# Patient Record
Sex: Female | Born: 1940 | Race: White | Hispanic: No | Marital: Married | Smoking: Former smoker
Health system: Southern US, Community
[De-identification: ages and names within clinical notes are randomized; demographics above are authoritative.]

## PROBLEM LIST (undated history)

## (undated) DIAGNOSIS — I10 Essential (primary) hypertension: Secondary | ICD-10-CM

## (undated) HISTORY — PX: TUBAL LIGATION: SHX77

## (undated) HISTORY — PX: ELBOW SURGERY: SHX618

## (undated) HISTORY — DX: Essential (primary) hypertension: I10

---

## 1998-04-17 ENCOUNTER — Other Ambulatory Visit: Admission: RE | Admit: 1998-04-17 | Discharge: 1998-04-17 | Payer: Self-pay | Admitting: Obstetrics & Gynecology

## 1999-04-10 ENCOUNTER — Other Ambulatory Visit: Admission: RE | Admit: 1999-04-10 | Discharge: 1999-04-10 | Payer: Self-pay | Admitting: Obstetrics and Gynecology

## 1999-04-10 ENCOUNTER — Encounter (INDEPENDENT_AMBULATORY_CARE_PROVIDER_SITE_OTHER): Payer: Self-pay

## 1999-08-14 ENCOUNTER — Encounter: Payer: Self-pay | Admitting: Obstetrics and Gynecology

## 1999-08-14 ENCOUNTER — Encounter: Admission: RE | Admit: 1999-08-14 | Discharge: 1999-08-14 | Payer: Self-pay | Admitting: Obstetrics & Gynecology

## 2001-09-18 ENCOUNTER — Other Ambulatory Visit: Admission: RE | Admit: 2001-09-18 | Discharge: 2001-09-18 | Payer: Self-pay | Admitting: Obstetrics & Gynecology

## 2003-03-29 ENCOUNTER — Other Ambulatory Visit: Admission: RE | Admit: 2003-03-29 | Discharge: 2003-03-29 | Payer: Self-pay | Admitting: Obstetrics & Gynecology

## 2005-04-25 ENCOUNTER — Other Ambulatory Visit: Admission: RE | Admit: 2005-04-25 | Discharge: 2005-04-25 | Payer: Self-pay | Admitting: Obstetrics & Gynecology

## 2007-09-21 ENCOUNTER — Encounter (INDEPENDENT_AMBULATORY_CARE_PROVIDER_SITE_OTHER): Payer: Self-pay | Admitting: General Surgery

## 2007-09-21 ENCOUNTER — Ambulatory Visit (HOSPITAL_COMMUNITY): Admission: RE | Admit: 2007-09-21 | Discharge: 2007-09-21 | Payer: Self-pay | Admitting: General Surgery

## 2009-01-05 ENCOUNTER — Encounter: Payer: Self-pay | Admitting: Cardiology

## 2009-01-05 ENCOUNTER — Ambulatory Visit: Payer: Self-pay | Admitting: Cardiology

## 2009-01-05 DIAGNOSIS — I6529 Occlusion and stenosis of unspecified carotid artery: Secondary | ICD-10-CM

## 2009-01-05 DIAGNOSIS — E782 Mixed hyperlipidemia: Secondary | ICD-10-CM

## 2009-01-05 DIAGNOSIS — R079 Chest pain, unspecified: Secondary | ICD-10-CM | POA: Insufficient documentation

## 2009-01-26 ENCOUNTER — Encounter: Payer: Self-pay | Admitting: Cardiology

## 2009-01-26 ENCOUNTER — Ambulatory Visit: Payer: Self-pay

## 2011-02-05 NOTE — Op Note (Signed)
[name], Abigail Bauer                ACCOUNT NO.:  [id number]   MEDICAL RECORD NO.:  [id number]          PATIENT TYPE:  AMB   LOCATION:  DAY                          FACILITY:  [facility]   PHYSICIAN:  Timothy E. Earlene Plater, M.D. DATE OF BIRTH:  [birth year]   DATE OF PROCEDURE:  09/21/2007  DATE OF DISCHARGE:                               OPERATIVE REPORT   PREOPERATIVE DIAGNOSIS:  Anal polyp.   POSTOPERATIVE DIAGNOSIS:  Anal polyp.   PROCEDURE:  Excision of anal polyp.   SURGEON:  Timothy E. Earlene Plater, M.D.   ANESTHESIA:  Local with standby.   Abigail Bauer recently was discovered to have an anal polyp.  Though she  knew it was present, it was not too bothersome.  Because of its large  size, excision is recommended.  She is seen, identified and the permit  signed.   DESCRIPTION OF PROCEDURE:  She is taken to the operating room, placed  supine, moderate IV sedation given.  She was placed in lithotomy.  Perianal area inspected, prepped and draped in the usual fashion.  The  polyp was easily prolapsable in the posterior position.  Anoscopy was  otherwise negative.  The polyp and the base were injected with Marcaine  0.5% with epinephrine and Wydase.  That was massaged in well and then at  the base of the polyp, a suture ligature was placed and the polyp was  cut off.  No complications.  Polyp submitted to pathology.  The wound was satisfactory.  Dressing applied.  Counts correct.  She  tolerated it well and was removed to recovery room in good condition.   Written and verbal instructions given including Vicodin.  She will be  seen and followed as an outpatient.      Timothy E. Earlene Plater, M.D.  Electronically Signed     TED/MEDQ  D:  09/21/2007  T:  09/21/2007  Job:  [id number]   cc:   Nadine Counts  Fax: [phone]   Griffith Citron, M.D.  Fax: [phone]   W. Varney Baas, M.D.  Fax: [phone]

## 2011-06-28 LAB — HEMOGLOBIN AND HEMATOCRIT, BLOOD
HCT: 37.9
Hemoglobin: 12.9

## 2011-06-28 LAB — BASIC METABOLIC PANEL
BUN: 10
CO2: 28
Calcium: 9.5
Chloride: 104
Creatinine, Ser: 0.9
GFR calc Af Amer: >60
GFR calc non Af Amer: >60
Glucose, Bld: 110 — ABNORMAL HIGH
Potassium: 3.7
Sodium: 141

## 2013-12-23 ENCOUNTER — Ambulatory Visit (INDEPENDENT_AMBULATORY_CARE_PROVIDER_SITE_OTHER): Payer: Medicare Other

## 2013-12-23 VITALS — BP 121/66 | HR 67 | Resp 18

## 2013-12-23 DIAGNOSIS — B351 Tinea unguium: Secondary | ICD-10-CM

## 2013-12-23 DIAGNOSIS — M79609 Pain in unspecified limb: Secondary | ICD-10-CM

## 2013-12-23 MED ORDER — TAVABOROLE 5 % EX SOLN: CUTANEOUS | Status: AC

## 2013-12-23 NOTE — Patient Instructions (Signed)
Onychomycosis/Fungal Toenails  WHAT IS IT? An infection that lies within the keratin of your nail plate that is caused by a fungus.  WHY ME? Fungal infections affect all ages, sexes, races, and creeds.  There may be many factors that predispose you to a fungal infection such as age, coexisting medical conditions such as diabetes, or an autoimmune disease; stress, medications, fatigue, genetics, etc.  Bottom line: fungus thrives in a warm, moist environment and your shoes offer such a location.  IS IT CONTAGIOUS? Theoretically, yes.  You do not want to share shoes, nail clippers or files with someone who has fungal toenails.  Walking around barefoot in the same room or sleeping in the same bed is unlikely to transfer the organism.  It is important to realize, however, that fungus can spread easily from one nail to the next on the same foot.  HOW DO WE TREAT THIS?  There are several ways to treat this condition.  Treatment may depend on many factors such as age, medications, pregnancy, liver and kidney conditions, etc.  It is best to ask your doctor which options are available to you.  1. No treatment.   Unlike many other medical concerns, you can live with this condition.  However for many people this can be a painful condition and may lead to ingrown toenails or a bacterial infection.  It is recommended that you keep the nails cut short to help reduce the amount of fungal nail. 2. Topical treatment.  These range from herbal remedies to prescription strength nail lacquers.  About 40-50% effective, topicals require twice daily application for approximately 9 to 12 months or until an entirely new nail has grown out.  The most effective topicals are medical grade medications available through physicians offices. 3. Oral antifungal medications.  With an 80-90% cure rate, the most common oral medication requires 3 to 4 months of therapy and stays in your system for a year as the new nail grows out.  Oral  antifungal medications do require blood work to make sure it is a safe drug for you.  A liver function panel will be performed prior to starting the medication and after the first month of treatment.  It is important to have the blood work performed to avoid any harmful side effects.  In general, this medication safe but blood work is required. 4. Laser Therapy.  This treatment is performed by applying a specialized laser to the affected nail plate.  This therapy is noninvasive, fast, and non-painful.  It is not covered by insurance and is therefore, out of pocket.  The results have been very good with a 80-95% cure rate.  [facility] is the only practice in the area to offer this therapy. 5. Permanent Nail Avulsion.  Removing the entire nail so that a new nail will not grow back.  Applied the kerydin topical nail medication as instructed once daily to each of the affected toenails for 12 months duration. Discontinue there is any irritation to skin or nail fold or any signs of ingrowing nail followup in the interim as needed for possible trimming if unable to cut'

## 2013-12-23 NOTE — Progress Notes (Signed)
   Subjective:    Patient ID: Abigail Bauer, female    DOB: Mar 31, 1941, 73 y.o.   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       Objective:   Physical Exam Neurovascular status appears to be intact pedal pulses palpable epicritic and proprioceptive sensations intact bilateral nails 1 and 5 bilateral show thickening hypertrophy and separation from the nailbed and incurvation crepitus. The fifth toenail on the right show some darkening posse some contusion patient case in her toes at the end of her shoes and some shoes and slight forward in the shoes causing some trauma or contusion of the toe discuss possibilities of neoplasm of the nail however is unlikely this is more likely Morton 10 or 15 year long-term onychomycosis. At this time patient is tried previous oral and fungal medications with little or no success but may not completely full regimen. Is not inch to the toes was try something as alternative at this time we discussed and recommended topical antifungal treatment should note neurovascular status intact pedal pulses are palpable epicritic and proprioceptive sensations intact there no contraindications to the medication at this time. Did make recommendation that if she does work outside she has a small been you should worsen instead of using sneakers a high top trail running shoe or hiking boot would be more appropriate and adequate space to toebox to prevent future contusion injury to the nails       Assessment & Plan:  Assessment onychomycosis and some pain discomfort nails 1 through 1 and 5 bilateral  at this time prescription for [place] Mon is patient will initiate topical antifungal as instructed once daily to the affected nails for 12 months duration followup in 3-6 months for palliative care if needed or nail check patient is advised may take 6 months to start seeing improvement  Harriet Masson DPM

## 2014-08-24 ENCOUNTER — Other Ambulatory Visit: Payer: Self-pay | Admitting: Obstetrics and Gynecology

## 2014-08-25 LAB — CYTOLOGY - PAP

## 2014-08-29 ENCOUNTER — Other Ambulatory Visit: Payer: Self-pay | Admitting: Obstetrics and Gynecology

## 2014-08-29 DIAGNOSIS — R928 Other abnormal and inconclusive findings on diagnostic imaging of breast: Secondary | ICD-10-CM

## 2014-09-13 ENCOUNTER — Ambulatory Visit
Admission: RE | Admit: 2014-09-13 | Discharge: 2014-09-13 | Disposition: A | Discharge status: Home / Self Care | Payer: Medicare Other | Source: Ambulatory Visit | Attending: Obstetrics and Gynecology | Admitting: Obstetrics and Gynecology

## 2014-09-13 DIAGNOSIS — R928 Other abnormal and inconclusive findings on diagnostic imaging of breast: Secondary | ICD-10-CM

## 2015-10-16 DIAGNOSIS — E7800 Pure hypercholesterolemia, unspecified: Secondary | ICD-10-CM | POA: Diagnosis not present

## 2015-10-16 DIAGNOSIS — Z79899 Other long term (current) drug therapy: Secondary | ICD-10-CM | POA: Diagnosis not present

## 2015-12-06 DIAGNOSIS — H2513 Age-related nuclear cataract, bilateral: Secondary | ICD-10-CM | POA: Diagnosis not present

## 2015-12-06 DIAGNOSIS — H2511 Age-related nuclear cataract, right eye: Secondary | ICD-10-CM | POA: Diagnosis not present

## 2015-12-06 DIAGNOSIS — H353131 Nonexudative age-related macular degeneration, bilateral, early dry stage: Secondary | ICD-10-CM | POA: Diagnosis not present

## 2016-01-18 DIAGNOSIS — H353132 Nonexudative age-related macular degeneration, bilateral, intermediate dry stage: Secondary | ICD-10-CM | POA: Diagnosis not present

## 2016-01-18 DIAGNOSIS — H2511 Age-related nuclear cataract, right eye: Secondary | ICD-10-CM | POA: Diagnosis not present

## 2016-01-18 DIAGNOSIS — H40013 Open angle with borderline findings, low risk, bilateral: Secondary | ICD-10-CM | POA: Diagnosis not present

## 2016-01-18 DIAGNOSIS — H2512 Age-related nuclear cataract, left eye: Secondary | ICD-10-CM | POA: Diagnosis not present

## 2016-02-14 DIAGNOSIS — H259 Unspecified age-related cataract: Secondary | ICD-10-CM | POA: Diagnosis not present

## 2016-02-14 DIAGNOSIS — I10 Essential (primary) hypertension: Secondary | ICD-10-CM | POA: Diagnosis not present

## 2016-02-14 DIAGNOSIS — Z6820 Body mass index (BMI) 20.0-20.9, adult: Secondary | ICD-10-CM | POA: Diagnosis not present

## 2016-02-14 DIAGNOSIS — E785 Hyperlipidemia, unspecified: Secondary | ICD-10-CM | POA: Diagnosis not present

## 2016-02-14 DIAGNOSIS — R0989 Other specified symptoms and signs involving the circulatory and respiratory systems: Secondary | ICD-10-CM | POA: Diagnosis not present

## 2016-02-19 DIAGNOSIS — R0989 Other specified symptoms and signs involving the circulatory and respiratory systems: Secondary | ICD-10-CM | POA: Diagnosis not present

## 2016-02-19 DIAGNOSIS — I6523 Occlusion and stenosis of bilateral carotid arteries: Secondary | ICD-10-CM | POA: Diagnosis not present

## 2016-03-17 IMAGING — MG MM DIAGNOSTIC UNILATERAL L
3 series · 3 of 3 positions shown · non-contrast
Comparison: Prior exams

CLINICAL DATA: Callback from screening for a possible mass or
asymmetry in the left breast on the MLO view only.

EXAM:
DIGITAL DIAGNOSTIC  LEFT MAMMOGRAM

[L MLO (1 of 2)]
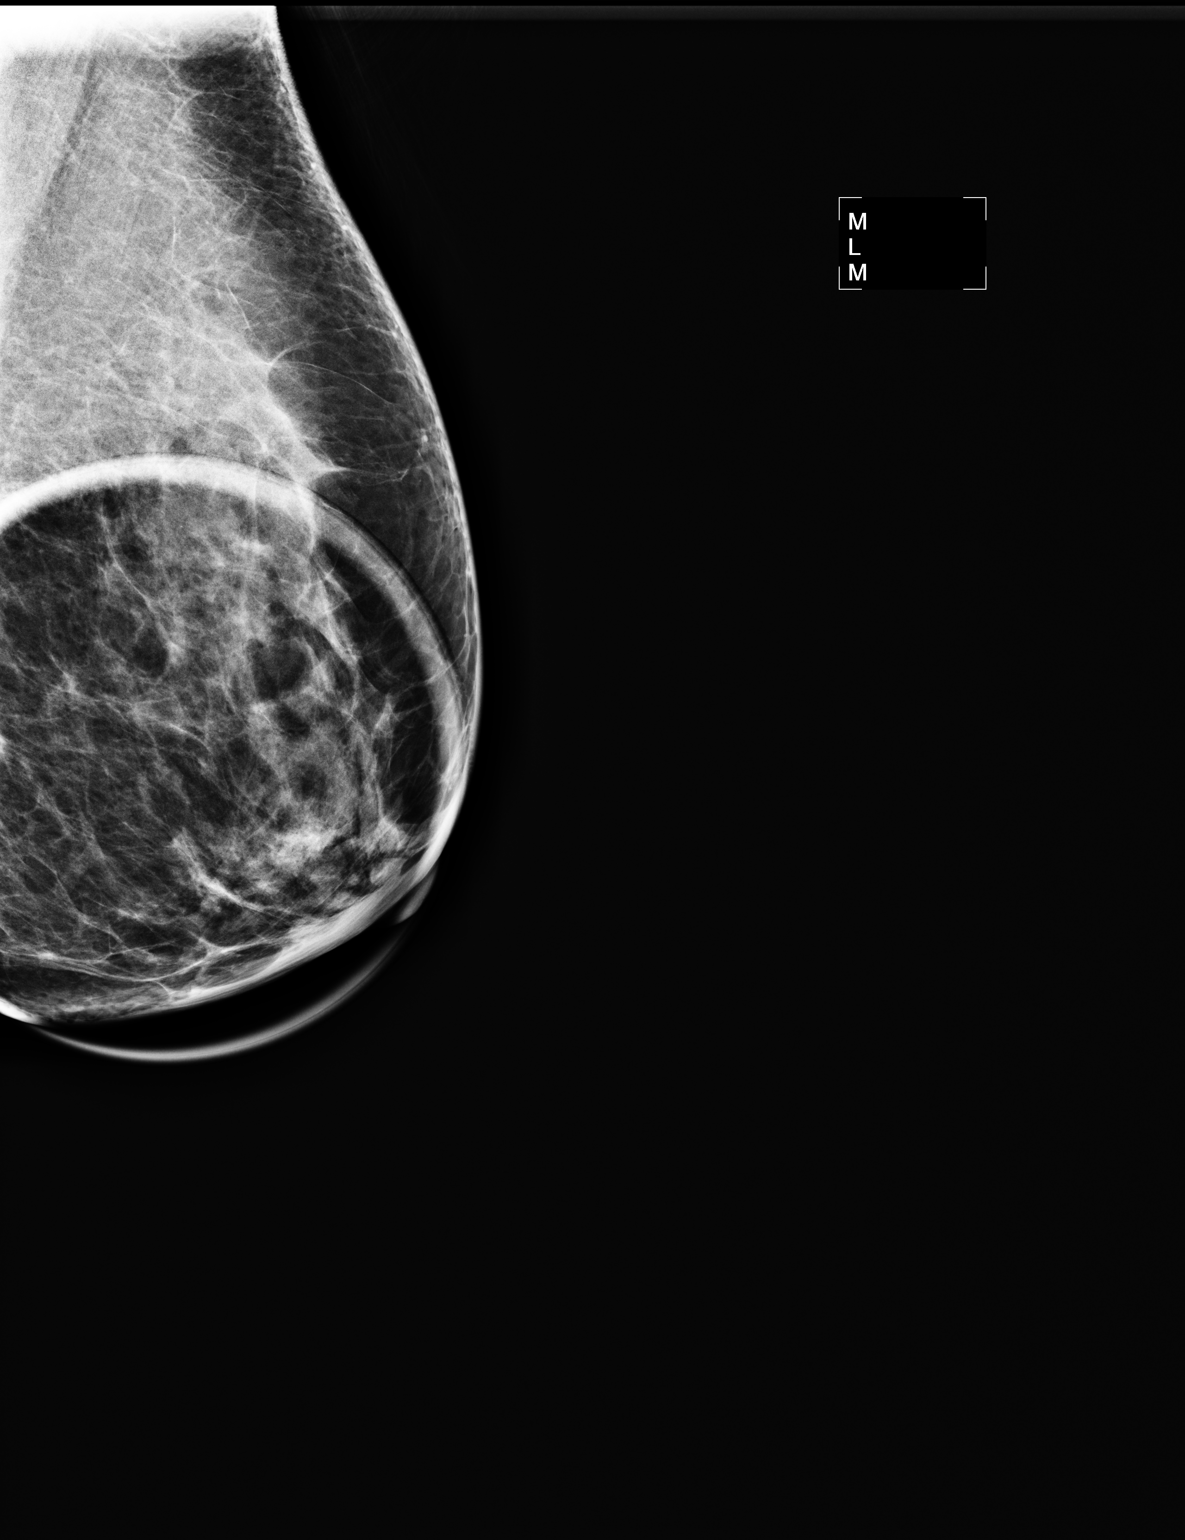

[L ML]
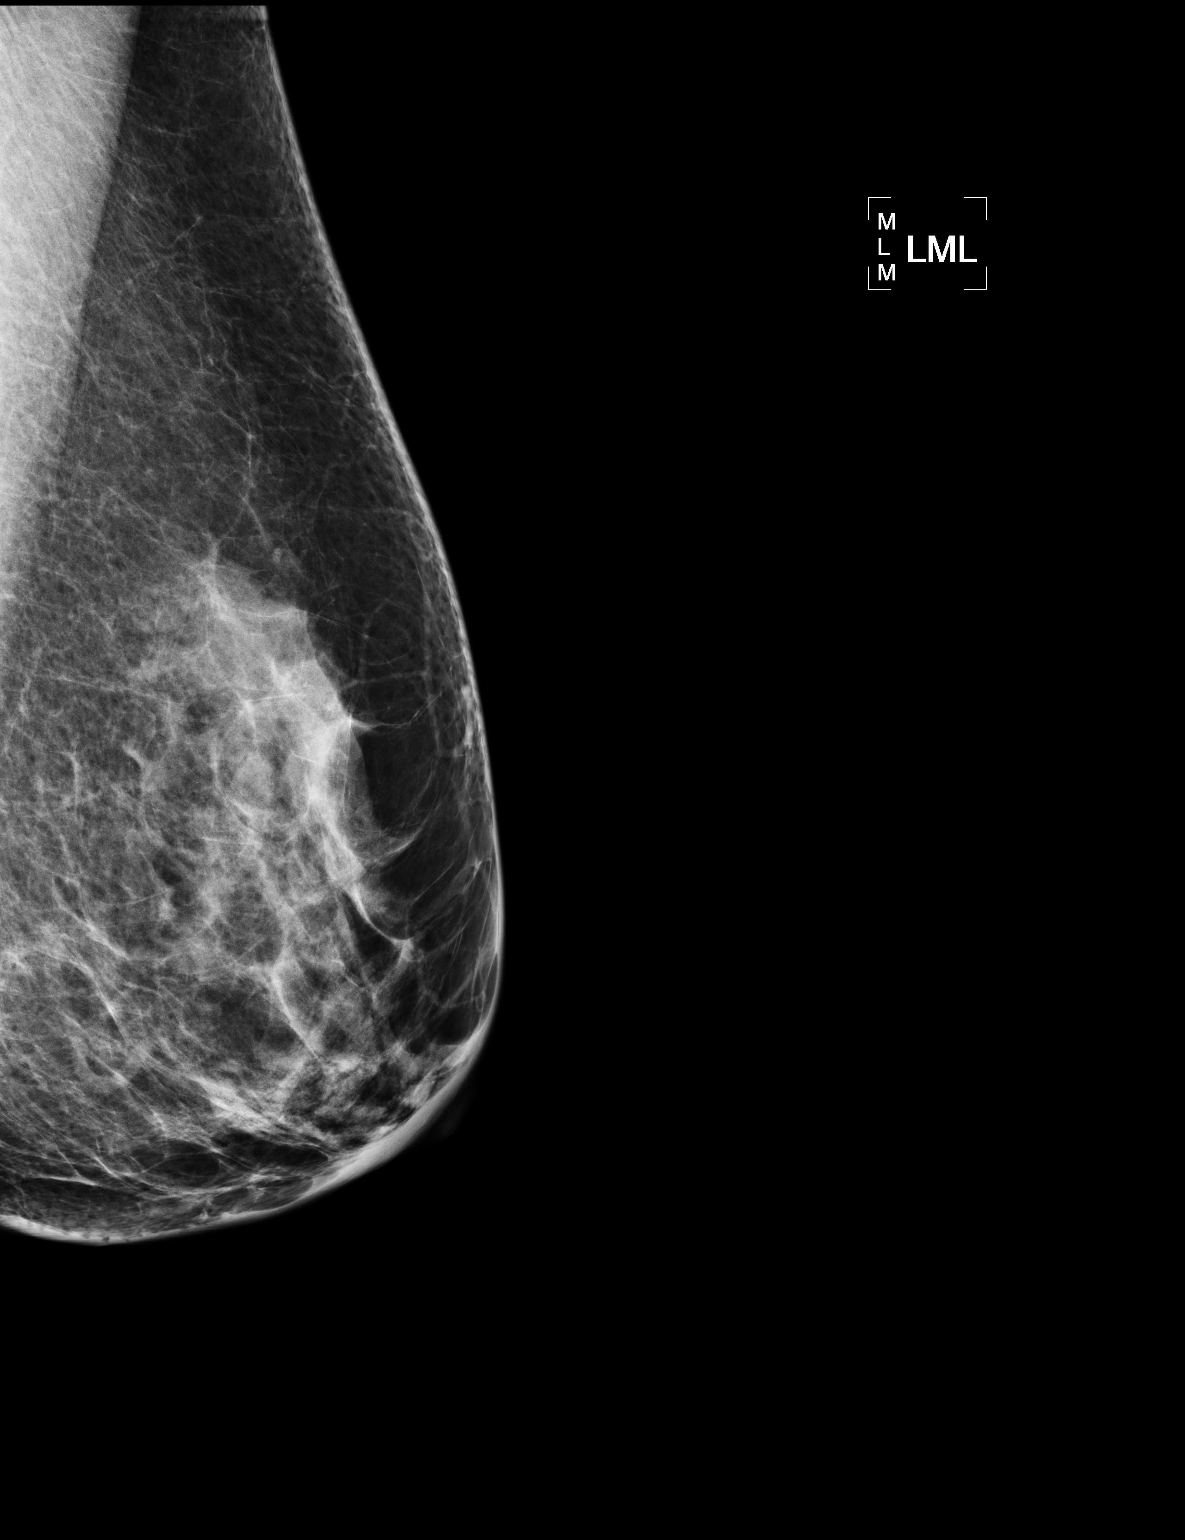

[L MLO (2 of 2)]
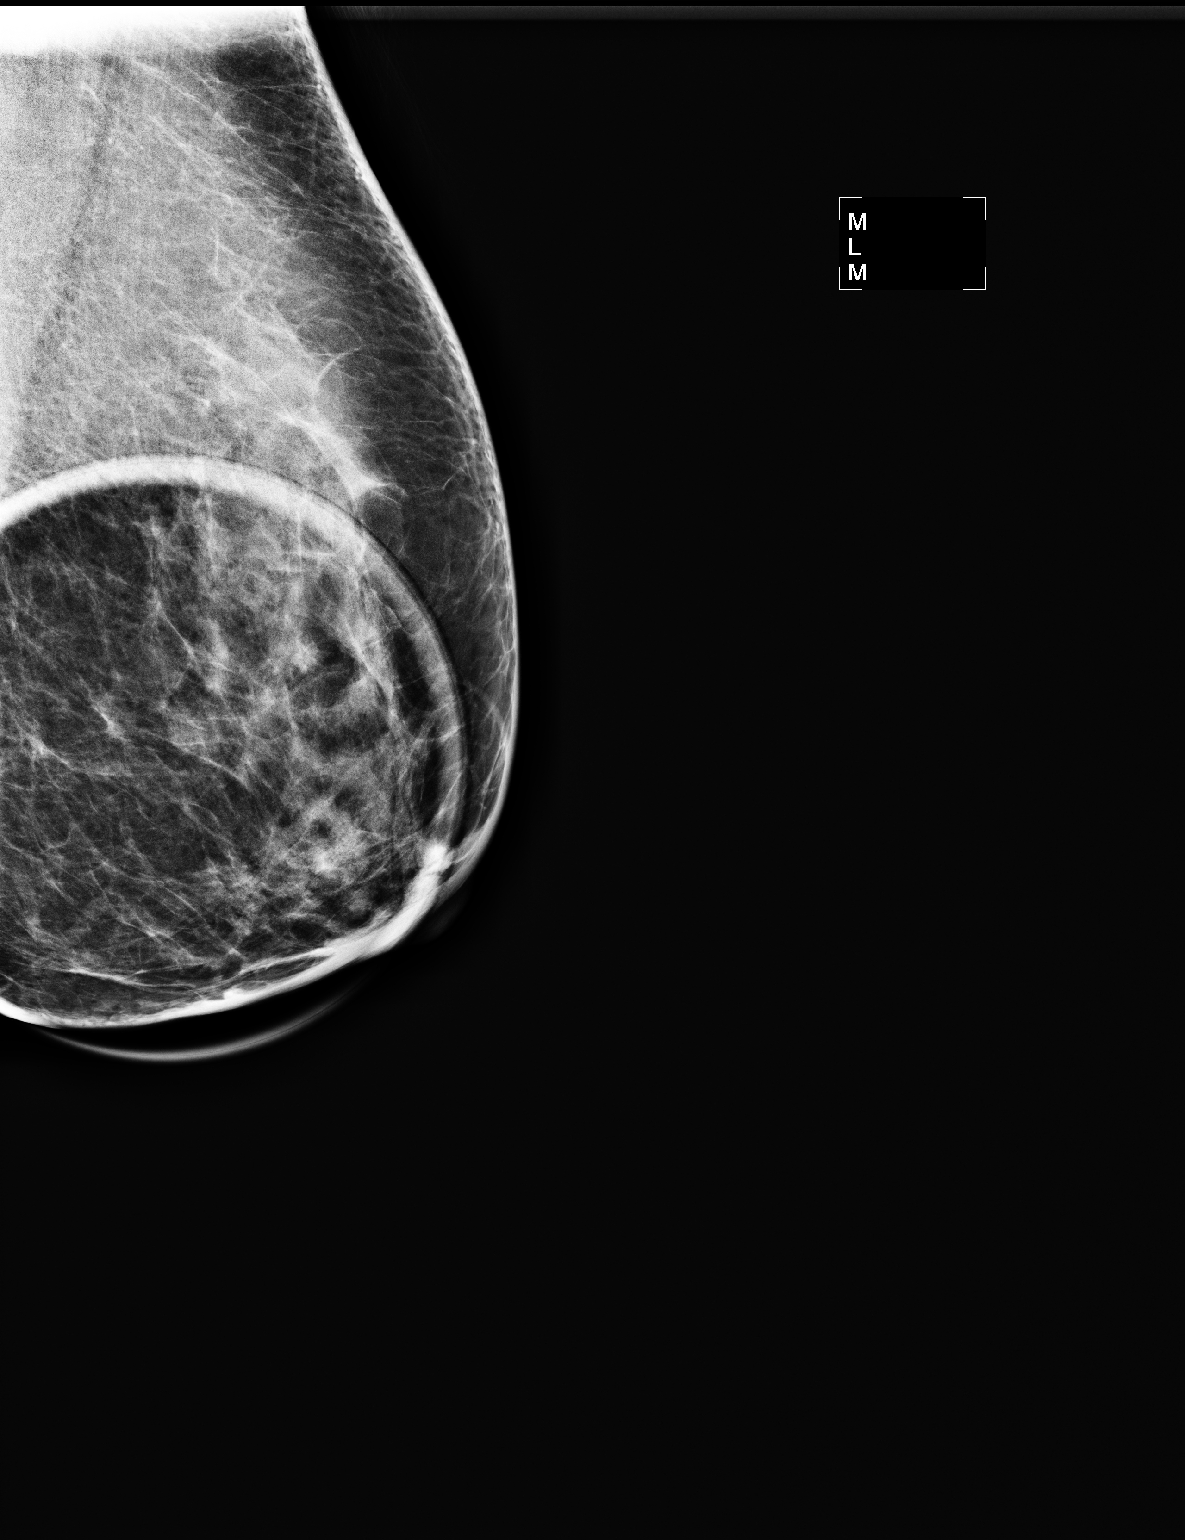

[3 of 3 positions shown; findings below may reference images not displayed]

ACR Breast Density Category b: There are scattered areas of
fibroglandular density.
FINDINGS: Area of asymmetry disperses on spot compression imaging. There is no
underlying mass or distortion.
IMPRESSION: Normal exam. No evidence of malignancy. Screening questionable
abnormality was due to superimposed fibroglandular tissue.

RECOMMENDATION:
Screening mammogram in one year.(Code:U2-3-D1I)

I have discussed the findings and recommendations with the patient.
Results were also provided in writing at the conclusion of the
visit. If applicable, a reminder letter will be sent to the patient
regarding the next appointment.

BI-RADS CATEGORY  1: Negative.

## 2016-03-19 DIAGNOSIS — Z7982 Long term (current) use of aspirin: Secondary | ICD-10-CM | POA: Diagnosis not present

## 2016-03-19 DIAGNOSIS — Z79899 Other long term (current) drug therapy: Secondary | ICD-10-CM | POA: Diagnosis not present

## 2016-03-19 DIAGNOSIS — E785 Hyperlipidemia, unspecified: Secondary | ICD-10-CM | POA: Diagnosis not present

## 2016-03-19 DIAGNOSIS — H2511 Age-related nuclear cataract, right eye: Secondary | ICD-10-CM | POA: Diagnosis not present

## 2016-04-03 DIAGNOSIS — H2512 Age-related nuclear cataract, left eye: Secondary | ICD-10-CM | POA: Diagnosis not present

## 2016-04-03 DIAGNOSIS — H25812 Combined forms of age-related cataract, left eye: Secondary | ICD-10-CM | POA: Diagnosis not present

## 2016-05-21 DIAGNOSIS — Z139 Encounter for screening, unspecified: Secondary | ICD-10-CM | POA: Diagnosis not present

## 2016-05-21 DIAGNOSIS — Z6820 Body mass index (BMI) 20.0-20.9, adult: Secondary | ICD-10-CM | POA: Diagnosis not present

## 2016-05-21 DIAGNOSIS — H9193 Unspecified hearing loss, bilateral: Secondary | ICD-10-CM | POA: Diagnosis not present

## 2016-05-21 DIAGNOSIS — E785 Hyperlipidemia, unspecified: Secondary | ICD-10-CM | POA: Diagnosis not present

## 2016-05-21 DIAGNOSIS — H6121 Impacted cerumen, right ear: Secondary | ICD-10-CM | POA: Diagnosis not present

## 2016-05-21 DIAGNOSIS — I6521 Occlusion and stenosis of right carotid artery: Secondary | ICD-10-CM | POA: Diagnosis not present

## 2016-05-21 DIAGNOSIS — I10 Essential (primary) hypertension: Secondary | ICD-10-CM | POA: Diagnosis not present

## 2016-08-06 DIAGNOSIS — H26491 Other secondary cataract, right eye: Secondary | ICD-10-CM | POA: Diagnosis not present

## 2016-08-23 DIAGNOSIS — R0989 Other specified symptoms and signs involving the circulatory and respiratory systems: Secondary | ICD-10-CM | POA: Diagnosis not present

## 2016-08-23 DIAGNOSIS — Z1389 Encounter for screening for other disorder: Secondary | ICD-10-CM | POA: Diagnosis not present

## 2016-08-23 DIAGNOSIS — Z6820 Body mass index (BMI) 20.0-20.9, adult: Secondary | ICD-10-CM | POA: Diagnosis not present

## 2016-08-23 DIAGNOSIS — Z9181 History of falling: Secondary | ICD-10-CM | POA: Diagnosis not present

## 2016-08-23 DIAGNOSIS — I6521 Occlusion and stenosis of right carotid artery: Secondary | ICD-10-CM | POA: Diagnosis not present

## 2016-08-23 DIAGNOSIS — E785 Hyperlipidemia, unspecified: Secondary | ICD-10-CM | POA: Diagnosis not present

## 2016-11-19 DIAGNOSIS — Z6820 Body mass index (BMI) 20.0-20.9, adult: Secondary | ICD-10-CM | POA: Diagnosis not present

## 2016-11-19 DIAGNOSIS — J069 Acute upper respiratory infection, unspecified: Secondary | ICD-10-CM | POA: Diagnosis not present

## 2016-11-19 DIAGNOSIS — Z20828 Contact with and (suspected) exposure to other viral communicable diseases: Secondary | ICD-10-CM | POA: Diagnosis not present

## 2016-11-19 DIAGNOSIS — J029 Acute pharyngitis, unspecified: Secondary | ICD-10-CM | POA: Diagnosis not present

## 2016-12-11 DIAGNOSIS — Z6820 Body mass index (BMI) 20.0-20.9, adult: Secondary | ICD-10-CM | POA: Diagnosis not present

## 2016-12-11 DIAGNOSIS — E785 Hyperlipidemia, unspecified: Secondary | ICD-10-CM | POA: Diagnosis not present

## 2017-02-12 DIAGNOSIS — H26492 Other secondary cataract, left eye: Secondary | ICD-10-CM | POA: Diagnosis not present

## 2017-02-12 DIAGNOSIS — H353131 Nonexudative age-related macular degeneration, bilateral, early dry stage: Secondary | ICD-10-CM | POA: Diagnosis not present

## 2017-04-03 DIAGNOSIS — E785 Hyperlipidemia, unspecified: Secondary | ICD-10-CM | POA: Diagnosis not present

## 2017-04-03 DIAGNOSIS — Z6820 Body mass index (BMI) 20.0-20.9, adult: Secondary | ICD-10-CM | POA: Diagnosis not present

## 2017-06-10 DIAGNOSIS — Z124 Encounter for screening for malignant neoplasm of cervix: Secondary | ICD-10-CM | POA: Diagnosis not present

## 2017-06-10 DIAGNOSIS — Z6820 Body mass index (BMI) 20.0-20.9, adult: Secondary | ICD-10-CM | POA: Diagnosis not present

## 2017-06-10 DIAGNOSIS — Z1231 Encounter for screening mammogram for malignant neoplasm of breast: Secondary | ICD-10-CM | POA: Diagnosis not present

## 2017-06-10 DIAGNOSIS — M8588 Other specified disorders of bone density and structure, other site: Secondary | ICD-10-CM | POA: Diagnosis not present

## 2017-06-10 DIAGNOSIS — Z01419 Encounter for gynecological examination (general) (routine) without abnormal findings: Secondary | ICD-10-CM | POA: Diagnosis not present

## 2017-06-21 DIAGNOSIS — S46911A Strain of unspecified muscle, fascia and tendon at shoulder and upper arm level, right arm, initial encounter: Secondary | ICD-10-CM | POA: Diagnosis not present

## 2017-07-09 DIAGNOSIS — L900 Lichen sclerosus et atrophicus: Secondary | ICD-10-CM | POA: Diagnosis not present

## 2017-07-29 DIAGNOSIS — E785 Hyperlipidemia, unspecified: Secondary | ICD-10-CM | POA: Diagnosis not present

## 2017-07-29 DIAGNOSIS — Z139 Encounter for screening, unspecified: Secondary | ICD-10-CM | POA: Diagnosis not present

## 2017-08-13 DIAGNOSIS — Z0000 Encounter for general adult medical examination without abnormal findings: Secondary | ICD-10-CM | POA: Diagnosis not present

## 2017-08-13 DIAGNOSIS — Z136 Encounter for screening for cardiovascular disorders: Secondary | ICD-10-CM | POA: Diagnosis not present

## 2017-08-13 DIAGNOSIS — Z9181 History of falling: Secondary | ICD-10-CM | POA: Diagnosis not present

## 2017-08-13 DIAGNOSIS — Z139 Encounter for screening, unspecified: Secondary | ICD-10-CM | POA: Diagnosis not present

## 2017-08-13 DIAGNOSIS — E785 Hyperlipidemia, unspecified: Secondary | ICD-10-CM | POA: Diagnosis not present

## 2017-08-13 DIAGNOSIS — Z1331 Encounter for screening for depression: Secondary | ICD-10-CM | POA: Diagnosis not present

## 2017-08-19 DIAGNOSIS — H524 Presbyopia: Secondary | ICD-10-CM | POA: Diagnosis not present

## 2017-08-19 DIAGNOSIS — H353131 Nonexudative age-related macular degeneration, bilateral, early dry stage: Secondary | ICD-10-CM | POA: Diagnosis not present

## 2017-08-19 DIAGNOSIS — H26492 Other secondary cataract, left eye: Secondary | ICD-10-CM | POA: Diagnosis not present

## 2017-10-20 DIAGNOSIS — Z961 Presence of intraocular lens: Secondary | ICD-10-CM | POA: Diagnosis not present

## 2017-11-11 DIAGNOSIS — Z6820 Body mass index (BMI) 20.0-20.9, adult: Secondary | ICD-10-CM | POA: Diagnosis not present

## 2017-11-11 DIAGNOSIS — E785 Hyperlipidemia, unspecified: Secondary | ICD-10-CM | POA: Diagnosis not present

## 2017-11-11 DIAGNOSIS — Z1331 Encounter for screening for depression: Secondary | ICD-10-CM | POA: Diagnosis not present

## 2017-11-27 DIAGNOSIS — J208 Acute bronchitis due to other specified organisms: Secondary | ICD-10-CM | POA: Diagnosis not present

## 2017-11-27 DIAGNOSIS — J019 Acute sinusitis, unspecified: Secondary | ICD-10-CM | POA: Diagnosis not present

## 2018-02-04 DIAGNOSIS — D485 Neoplasm of uncertain behavior of skin: Secondary | ICD-10-CM | POA: Diagnosis not present

## 2018-02-04 DIAGNOSIS — C44321 Squamous cell carcinoma of skin of nose: Secondary | ICD-10-CM | POA: Diagnosis not present

## 2018-02-04 DIAGNOSIS — L570 Actinic keratosis: Secondary | ICD-10-CM | POA: Diagnosis not present

## 2018-02-06 DIAGNOSIS — S93402A Sprain of unspecified ligament of left ankle, initial encounter: Secondary | ICD-10-CM | POA: Diagnosis not present

## 2018-02-26 DIAGNOSIS — C44321 Squamous cell carcinoma of skin of nose: Secondary | ICD-10-CM | POA: Diagnosis not present

## 2018-03-03 DIAGNOSIS — Z6820 Body mass index (BMI) 20.0-20.9, adult: Secondary | ICD-10-CM | POA: Diagnosis not present

## 2018-03-03 DIAGNOSIS — E785 Hyperlipidemia, unspecified: Secondary | ICD-10-CM | POA: Diagnosis not present

## 2018-06-16 DIAGNOSIS — Z1231 Encounter for screening mammogram for malignant neoplasm of breast: Secondary | ICD-10-CM | POA: Diagnosis not present

## 2018-06-16 DIAGNOSIS — E785 Hyperlipidemia, unspecified: Secondary | ICD-10-CM | POA: Diagnosis not present

## 2018-06-16 DIAGNOSIS — Z6820 Body mass index (BMI) 20.0-20.9, adult: Secondary | ICD-10-CM | POA: Diagnosis not present

## 2018-10-13 DIAGNOSIS — Z1231 Encounter for screening mammogram for malignant neoplasm of breast: Secondary | ICD-10-CM | POA: Diagnosis not present

## 2018-10-13 DIAGNOSIS — Z6820 Body mass index (BMI) 20.0-20.9, adult: Secondary | ICD-10-CM | POA: Diagnosis not present

## 2018-10-13 DIAGNOSIS — E785 Hyperlipidemia, unspecified: Secondary | ICD-10-CM | POA: Diagnosis not present

## 2018-10-13 DIAGNOSIS — R030 Elevated blood-pressure reading, without diagnosis of hypertension: Secondary | ICD-10-CM | POA: Diagnosis not present

## 2018-11-23 DIAGNOSIS — C44321 Squamous cell carcinoma of skin of nose: Secondary | ICD-10-CM | POA: Diagnosis not present

## 2018-11-23 DIAGNOSIS — D485 Neoplasm of uncertain behavior of skin: Secondary | ICD-10-CM | POA: Diagnosis not present

## 2018-12-14 DIAGNOSIS — L570 Actinic keratosis: Secondary | ICD-10-CM | POA: Diagnosis not present

## 2019-02-03 DIAGNOSIS — Z9181 History of falling: Secondary | ICD-10-CM | POA: Diagnosis not present

## 2019-02-03 DIAGNOSIS — Z139 Encounter for screening, unspecified: Secondary | ICD-10-CM | POA: Diagnosis not present

## 2019-02-03 DIAGNOSIS — Z1331 Encounter for screening for depression: Secondary | ICD-10-CM | POA: Diagnosis not present

## 2019-02-03 DIAGNOSIS — E785 Hyperlipidemia, unspecified: Secondary | ICD-10-CM | POA: Diagnosis not present

## 2019-02-03 DIAGNOSIS — Z6820 Body mass index (BMI) 20.0-20.9, adult: Secondary | ICD-10-CM | POA: Diagnosis not present

## 2019-02-03 DIAGNOSIS — I10 Essential (primary) hypertension: Secondary | ICD-10-CM | POA: Diagnosis not present

## 2019-03-17 DIAGNOSIS — H353112 Nonexudative age-related macular degeneration, right eye, intermediate dry stage: Secondary | ICD-10-CM | POA: Diagnosis not present

## 2019-03-17 DIAGNOSIS — H524 Presbyopia: Secondary | ICD-10-CM | POA: Diagnosis not present

## 2019-03-24 DIAGNOSIS — D485 Neoplasm of uncertain behavior of skin: Secondary | ICD-10-CM | POA: Diagnosis not present

## 2019-03-24 DIAGNOSIS — B079 Viral wart, unspecified: Secondary | ICD-10-CM | POA: Diagnosis not present

## 2019-03-24 DIAGNOSIS — D1801 Hemangioma of skin and subcutaneous tissue: Secondary | ICD-10-CM | POA: Diagnosis not present

## 2019-03-24 DIAGNOSIS — Z85828 Personal history of other malignant neoplasm of skin: Secondary | ICD-10-CM | POA: Diagnosis not present

## 2019-03-24 DIAGNOSIS — L570 Actinic keratosis: Secondary | ICD-10-CM | POA: Diagnosis not present

## 2019-03-24 DIAGNOSIS — D229 Melanocytic nevi, unspecified: Secondary | ICD-10-CM | POA: Diagnosis not present

## 2019-03-24 DIAGNOSIS — L821 Other seborrheic keratosis: Secondary | ICD-10-CM | POA: Diagnosis not present

## 2019-03-25 DIAGNOSIS — L292 Pruritus vulvae: Secondary | ICD-10-CM | POA: Diagnosis not present

## 2019-03-25 DIAGNOSIS — Z01419 Encounter for gynecological examination (general) (routine) without abnormal findings: Secondary | ICD-10-CM | POA: Diagnosis not present

## 2019-03-25 DIAGNOSIS — Z1231 Encounter for screening mammogram for malignant neoplasm of breast: Secondary | ICD-10-CM | POA: Diagnosis not present

## 2019-03-25 DIAGNOSIS — Z6820 Body mass index (BMI) 20.0-20.9, adult: Secondary | ICD-10-CM | POA: Diagnosis not present

## 2019-03-25 DIAGNOSIS — Z124 Encounter for screening for malignant neoplasm of cervix: Secondary | ICD-10-CM | POA: Diagnosis not present

## 2019-03-25 DIAGNOSIS — N904 Leukoplakia of vulva: Secondary | ICD-10-CM | POA: Diagnosis not present

## 2019-07-13 DIAGNOSIS — S81812A Laceration without foreign body, left lower leg, initial encounter: Secondary | ICD-10-CM | POA: Diagnosis not present

## 2019-07-13 DIAGNOSIS — Z23 Encounter for immunization: Secondary | ICD-10-CM | POA: Diagnosis not present

## 2019-08-12 DIAGNOSIS — N958 Other specified menopausal and perimenopausal disorders: Secondary | ICD-10-CM | POA: Diagnosis not present

## 2019-08-12 DIAGNOSIS — M8588 Other specified disorders of bone density and structure, other site: Secondary | ICD-10-CM | POA: Diagnosis not present

## 2019-08-31 DIAGNOSIS — Z1211 Encounter for screening for malignant neoplasm of colon: Secondary | ICD-10-CM | POA: Diagnosis not present

## 2019-08-31 DIAGNOSIS — I10 Essential (primary) hypertension: Secondary | ICD-10-CM | POA: Diagnosis not present

## 2019-08-31 DIAGNOSIS — Z6820 Body mass index (BMI) 20.0-20.9, adult: Secondary | ICD-10-CM | POA: Diagnosis not present

## 2019-08-31 DIAGNOSIS — E785 Hyperlipidemia, unspecified: Secondary | ICD-10-CM | POA: Diagnosis not present

## 2019-09-09 DIAGNOSIS — Z0000 Encounter for general adult medical examination without abnormal findings: Secondary | ICD-10-CM | POA: Diagnosis not present

## 2019-09-09 DIAGNOSIS — Z1331 Encounter for screening for depression: Secondary | ICD-10-CM | POA: Diagnosis not present

## 2019-09-09 DIAGNOSIS — Z9181 History of falling: Secondary | ICD-10-CM | POA: Diagnosis not present

## 2019-09-09 DIAGNOSIS — E785 Hyperlipidemia, unspecified: Secondary | ICD-10-CM | POA: Diagnosis not present

## 2019-09-09 DIAGNOSIS — Z136 Encounter for screening for cardiovascular disorders: Secondary | ICD-10-CM | POA: Diagnosis not present

## 2019-10-06 DIAGNOSIS — M8580 Other specified disorders of bone density and structure, unspecified site: Secondary | ICD-10-CM | POA: Diagnosis not present

## 2020-01-06 DIAGNOSIS — E559 Vitamin D deficiency, unspecified: Secondary | ICD-10-CM | POA: Diagnosis not present

## 2020-03-22 DIAGNOSIS — Z961 Presence of intraocular lens: Secondary | ICD-10-CM | POA: Diagnosis not present

## 2020-03-22 DIAGNOSIS — H353132 Nonexudative age-related macular degeneration, bilateral, intermediate dry stage: Secondary | ICD-10-CM | POA: Diagnosis not present

## 2020-03-22 DIAGNOSIS — H524 Presbyopia: Secondary | ICD-10-CM | POA: Diagnosis not present

## 2020-03-24 DIAGNOSIS — E785 Hyperlipidemia, unspecified: Secondary | ICD-10-CM | POA: Diagnosis not present

## 2020-03-24 DIAGNOSIS — I10 Essential (primary) hypertension: Secondary | ICD-10-CM | POA: Diagnosis not present

## 2020-03-24 DIAGNOSIS — Z139 Encounter for screening, unspecified: Secondary | ICD-10-CM | POA: Diagnosis not present

## 2020-03-24 DIAGNOSIS — Z6820 Body mass index (BMI) 20.0-20.9, adult: Secondary | ICD-10-CM | POA: Diagnosis not present

## 2020-05-17 DIAGNOSIS — M898X1 Other specified disorders of bone, shoulder: Secondary | ICD-10-CM | POA: Diagnosis not present

## 2020-06-05 DIAGNOSIS — E785 Hyperlipidemia, unspecified: Secondary | ICD-10-CM | POA: Diagnosis not present

## 2020-06-05 DIAGNOSIS — I10 Essential (primary) hypertension: Secondary | ICD-10-CM | POA: Diagnosis not present

## 2020-06-05 DIAGNOSIS — R0989 Other specified symptoms and signs involving the circulatory and respiratory systems: Secondary | ICD-10-CM | POA: Diagnosis not present

## 2020-06-05 DIAGNOSIS — Z8249 Family history of ischemic heart disease and other diseases of the circulatory system: Secondary | ICD-10-CM | POA: Diagnosis not present

## 2020-06-05 DIAGNOSIS — I6521 Occlusion and stenosis of right carotid artery: Secondary | ICD-10-CM | POA: Diagnosis not present

## 2020-06-06 DIAGNOSIS — I6521 Occlusion and stenosis of right carotid artery: Secondary | ICD-10-CM | POA: Diagnosis not present

## 2020-06-06 DIAGNOSIS — I10 Essential (primary) hypertension: Secondary | ICD-10-CM | POA: Diagnosis not present

## 2020-06-06 DIAGNOSIS — R002 Palpitations: Secondary | ICD-10-CM | POA: Diagnosis not present

## 2020-07-28 DIAGNOSIS — Z6820 Body mass index (BMI) 20.0-20.9, adult: Secondary | ICD-10-CM | POA: Diagnosis not present

## 2020-07-28 DIAGNOSIS — Z01419 Encounter for gynecological examination (general) (routine) without abnormal findings: Secondary | ICD-10-CM | POA: Diagnosis not present

## 2020-07-28 DIAGNOSIS — Z1231 Encounter for screening mammogram for malignant neoplasm of breast: Secondary | ICD-10-CM | POA: Diagnosis not present

## 2020-08-28 DIAGNOSIS — Z87891 Personal history of nicotine dependence: Secondary | ICD-10-CM | POA: Diagnosis not present

## 2020-08-28 DIAGNOSIS — I6523 Occlusion and stenosis of bilateral carotid arteries: Secondary | ICD-10-CM | POA: Diagnosis not present

## 2020-09-08 DIAGNOSIS — Z809 Family history of malignant neoplasm, unspecified: Secondary | ICD-10-CM | POA: Diagnosis not present

## 2020-09-13 DIAGNOSIS — Z0000 Encounter for general adult medical examination without abnormal findings: Secondary | ICD-10-CM | POA: Diagnosis not present

## 2020-09-13 DIAGNOSIS — Z1331 Encounter for screening for depression: Secondary | ICD-10-CM | POA: Diagnosis not present

## 2020-09-13 DIAGNOSIS — E785 Hyperlipidemia, unspecified: Secondary | ICD-10-CM | POA: Diagnosis not present

## 2020-09-13 DIAGNOSIS — Z9181 History of falling: Secondary | ICD-10-CM | POA: Diagnosis not present

## 2020-10-18 DIAGNOSIS — Z6820 Body mass index (BMI) 20.0-20.9, adult: Secondary | ICD-10-CM | POA: Diagnosis not present

## 2020-10-18 DIAGNOSIS — Z23 Encounter for immunization: Secondary | ICD-10-CM | POA: Diagnosis not present

## 2020-10-18 DIAGNOSIS — E785 Hyperlipidemia, unspecified: Secondary | ICD-10-CM | POA: Diagnosis not present

## 2020-10-18 DIAGNOSIS — I10 Essential (primary) hypertension: Secondary | ICD-10-CM | POA: Diagnosis not present

## 2021-01-20 DIAGNOSIS — B029 Zoster without complications: Secondary | ICD-10-CM | POA: Diagnosis not present

## 2021-02-01 DIAGNOSIS — B0229 Other postherpetic nervous system involvement: Secondary | ICD-10-CM | POA: Diagnosis not present

## 2021-03-21 DIAGNOSIS — B0229 Other postherpetic nervous system involvement: Secondary | ICD-10-CM | POA: Diagnosis not present

## 2021-03-28 DIAGNOSIS — H353114 Nonexudative age-related macular degeneration, right eye, advanced atrophic with subfoveal involvement: Secondary | ICD-10-CM | POA: Diagnosis not present

## 2021-03-29 DIAGNOSIS — H353122 Nonexudative age-related macular degeneration, left eye, intermediate dry stage: Secondary | ICD-10-CM | POA: Diagnosis not present

## 2021-03-29 DIAGNOSIS — H353114 Nonexudative age-related macular degeneration, right eye, advanced atrophic with subfoveal involvement: Secondary | ICD-10-CM | POA: Diagnosis not present

## 2021-04-17 DIAGNOSIS — Z6820 Body mass index (BMI) 20.0-20.9, adult: Secondary | ICD-10-CM | POA: Diagnosis not present

## 2021-04-17 DIAGNOSIS — I10 Essential (primary) hypertension: Secondary | ICD-10-CM | POA: Diagnosis not present

## 2021-04-17 DIAGNOSIS — E785 Hyperlipidemia, unspecified: Secondary | ICD-10-CM | POA: Diagnosis not present

## 2021-04-17 DIAGNOSIS — B0229 Other postherpetic nervous system involvement: Secondary | ICD-10-CM | POA: Diagnosis not present

## 2021-07-24 DIAGNOSIS — I2510 Atherosclerotic heart disease of native coronary artery without angina pectoris: Secondary | ICD-10-CM | POA: Diagnosis not present

## 2021-07-24 DIAGNOSIS — I10 Essential (primary) hypertension: Secondary | ICD-10-CM | POA: Diagnosis not present

## 2021-07-24 DIAGNOSIS — Z8249 Family history of ischemic heart disease and other diseases of the circulatory system: Secondary | ICD-10-CM | POA: Diagnosis not present

## 2021-07-24 DIAGNOSIS — E785 Hyperlipidemia, unspecified: Secondary | ICD-10-CM | POA: Diagnosis not present

## 2021-09-19 DIAGNOSIS — E785 Hyperlipidemia, unspecified: Secondary | ICD-10-CM | POA: Diagnosis not present

## 2021-09-19 DIAGNOSIS — Z0000 Encounter for general adult medical examination without abnormal findings: Secondary | ICD-10-CM | POA: Diagnosis not present

## 2021-09-19 DIAGNOSIS — Z1331 Encounter for screening for depression: Secondary | ICD-10-CM | POA: Diagnosis not present

## 2021-09-19 DIAGNOSIS — Z9181 History of falling: Secondary | ICD-10-CM | POA: Diagnosis not present

## 2021-09-19 DIAGNOSIS — Z139 Encounter for screening, unspecified: Secondary | ICD-10-CM | POA: Diagnosis not present

## 2021-09-27 DIAGNOSIS — H353114 Nonexudative age-related macular degeneration, right eye, advanced atrophic with subfoveal involvement: Secondary | ICD-10-CM | POA: Diagnosis not present

## 2021-09-27 DIAGNOSIS — H353122 Nonexudative age-related macular degeneration, left eye, intermediate dry stage: Secondary | ICD-10-CM | POA: Diagnosis not present

## 2021-10-23 DIAGNOSIS — E785 Hyperlipidemia, unspecified: Secondary | ICD-10-CM | POA: Diagnosis not present

## 2021-10-23 DIAGNOSIS — I10 Essential (primary) hypertension: Secondary | ICD-10-CM | POA: Diagnosis not present

## 2022-03-27 DIAGNOSIS — H04123 Dry eye syndrome of bilateral lacrimal glands: Secondary | ICD-10-CM | POA: Diagnosis not present

## 2022-03-27 DIAGNOSIS — H353122 Nonexudative age-related macular degeneration, left eye, intermediate dry stage: Secondary | ICD-10-CM | POA: Diagnosis not present

## 2022-04-09 DIAGNOSIS — L821 Other seborrheic keratosis: Secondary | ICD-10-CM | POA: Diagnosis not present

## 2022-04-09 DIAGNOSIS — Z85828 Personal history of other malignant neoplasm of skin: Secondary | ICD-10-CM | POA: Diagnosis not present

## 2022-04-09 DIAGNOSIS — L814 Other melanin hyperpigmentation: Secondary | ICD-10-CM | POA: Diagnosis not present

## 2022-04-09 DIAGNOSIS — Z08 Encounter for follow-up examination after completed treatment for malignant neoplasm: Secondary | ICD-10-CM | POA: Diagnosis not present

## 2022-04-09 DIAGNOSIS — D225 Melanocytic nevi of trunk: Secondary | ICD-10-CM | POA: Diagnosis not present

## 2022-04-22 DIAGNOSIS — G729 Myopathy, unspecified: Secondary | ICD-10-CM | POA: Diagnosis not present

## 2022-04-22 DIAGNOSIS — I10 Essential (primary) hypertension: Secondary | ICD-10-CM | POA: Diagnosis not present

## 2022-04-22 DIAGNOSIS — Z6820 Body mass index (BMI) 20.0-20.9, adult: Secondary | ICD-10-CM | POA: Diagnosis not present

## 2022-04-22 DIAGNOSIS — E785 Hyperlipidemia, unspecified: Secondary | ICD-10-CM | POA: Diagnosis not present

## 2022-09-26 DIAGNOSIS — H353114 Nonexudative age-related macular degeneration, right eye, advanced atrophic with subfoveal involvement: Secondary | ICD-10-CM | POA: Diagnosis not present

## 2022-09-26 DIAGNOSIS — H353122 Nonexudative age-related macular degeneration, left eye, intermediate dry stage: Secondary | ICD-10-CM | POA: Diagnosis not present

## 2022-11-08 DIAGNOSIS — Z6820 Body mass index (BMI) 20.0-20.9, adult: Secondary | ICD-10-CM | POA: Diagnosis not present

## 2022-11-08 DIAGNOSIS — I10 Essential (primary) hypertension: Secondary | ICD-10-CM | POA: Diagnosis not present

## 2022-11-08 DIAGNOSIS — E785 Hyperlipidemia, unspecified: Secondary | ICD-10-CM | POA: Diagnosis not present

## 2023-04-03 DIAGNOSIS — H353114 Nonexudative age-related macular degeneration, right eye, advanced atrophic with subfoveal involvement: Secondary | ICD-10-CM | POA: Diagnosis not present

## 2023-04-23 DIAGNOSIS — Z9181 History of falling: Secondary | ICD-10-CM | POA: Diagnosis not present

## 2023-04-23 DIAGNOSIS — Z0000 Encounter for general adult medical examination without abnormal findings: Secondary | ICD-10-CM | POA: Diagnosis not present

## 2023-05-14 DIAGNOSIS — G729 Myopathy, unspecified: Secondary | ICD-10-CM | POA: Diagnosis not present

## 2023-05-14 DIAGNOSIS — I10 Essential (primary) hypertension: Secondary | ICD-10-CM | POA: Diagnosis not present

## 2023-05-14 DIAGNOSIS — E785 Hyperlipidemia, unspecified: Secondary | ICD-10-CM | POA: Diagnosis not present

## 2023-05-14 DIAGNOSIS — Z681 Body mass index (BMI) 19 or less, adult: Secondary | ICD-10-CM | POA: Diagnosis not present

## 2023-05-14 DIAGNOSIS — R0609 Other forms of dyspnea: Secondary | ICD-10-CM | POA: Diagnosis not present

## 2023-10-15 DIAGNOSIS — H353114 Nonexudative age-related macular degeneration, right eye, advanced atrophic with subfoveal involvement: Secondary | ICD-10-CM | POA: Diagnosis not present

## 2023-11-10 DIAGNOSIS — M79662 Pain in left lower leg: Secondary | ICD-10-CM | POA: Diagnosis not present

## 2023-11-10 DIAGNOSIS — M79605 Pain in left leg: Secondary | ICD-10-CM | POA: Diagnosis not present

## 2023-11-10 DIAGNOSIS — M79672 Pain in left foot: Secondary | ICD-10-CM | POA: Diagnosis not present

## 2023-11-10 DIAGNOSIS — M19072 Primary osteoarthritis, left ankle and foot: Secondary | ICD-10-CM | POA: Diagnosis not present

## 2023-11-17 DIAGNOSIS — R0609 Other forms of dyspnea: Secondary | ICD-10-CM | POA: Diagnosis not present

## 2023-11-17 DIAGNOSIS — Z681 Body mass index (BMI) 19 or less, adult: Secondary | ICD-10-CM | POA: Diagnosis not present

## 2023-11-17 DIAGNOSIS — G720 Drug-induced myopathy: Secondary | ICD-10-CM | POA: Diagnosis not present

## 2023-11-17 DIAGNOSIS — I10 Essential (primary) hypertension: Secondary | ICD-10-CM | POA: Diagnosis not present

## 2023-11-17 DIAGNOSIS — E785 Hyperlipidemia, unspecified: Secondary | ICD-10-CM | POA: Diagnosis not present

## 2023-11-17 DIAGNOSIS — T466X5A Adverse effect of antihyperlipidemic and antiarteriosclerotic drugs, initial encounter: Secondary | ICD-10-CM | POA: Diagnosis not present

## 2024-04-08 DIAGNOSIS — H353114 Nonexudative age-related macular degeneration, right eye, advanced atrophic with subfoveal involvement: Secondary | ICD-10-CM | POA: Diagnosis not present

## 2024-05-19 DIAGNOSIS — Z681 Body mass index (BMI) 19 or less, adult: Secondary | ICD-10-CM | POA: Diagnosis not present

## 2024-05-19 DIAGNOSIS — T466X5A Adverse effect of antihyperlipidemic and antiarteriosclerotic drugs, initial encounter: Secondary | ICD-10-CM | POA: Diagnosis not present

## 2024-05-19 DIAGNOSIS — I6521 Occlusion and stenosis of right carotid artery: Secondary | ICD-10-CM | POA: Diagnosis not present

## 2024-05-19 DIAGNOSIS — R0609 Other forms of dyspnea: Secondary | ICD-10-CM | POA: Diagnosis not present

## 2024-05-19 DIAGNOSIS — E785 Hyperlipidemia, unspecified: Secondary | ICD-10-CM | POA: Diagnosis not present

## 2024-05-19 DIAGNOSIS — G720 Drug-induced myopathy: Secondary | ICD-10-CM | POA: Diagnosis not present

## 2024-05-19 DIAGNOSIS — I10 Essential (primary) hypertension: Secondary | ICD-10-CM | POA: Diagnosis not present

## 2024-05-21 DIAGNOSIS — I6521 Occlusion and stenosis of right carotid artery: Secondary | ICD-10-CM | POA: Diagnosis not present
